# Patient Record
Sex: Male | Born: 2000 | Race: White | Hispanic: No | Marital: Single | Smoking: Never smoker
Health system: Southern US, Community
[De-identification: ages and names within clinical notes are randomized; demographics above are authoritative.]

## PROBLEM LIST (undated history)

## (undated) HISTORY — PX: TONSILLECTOMY: SUR1361

---
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
## 2020-03-25 DIAGNOSIS — R070 Pain in throat: Secondary | ICD-10-CM | POA: Diagnosis not present
